# Patient Record
Sex: Female | Born: 1940 | Race: White | Hispanic: No | Smoking: Never smoker
Health system: Southern US, Community
[De-identification: ages and names within clinical notes are randomized; demographics above are authoritative.]

## PROBLEM LIST (undated history)

## (undated) DIAGNOSIS — M79604 Pain in right leg: Secondary | ICD-10-CM

## (undated) DIAGNOSIS — F32A Depression, unspecified: Secondary | ICD-10-CM

## (undated) DIAGNOSIS — E119 Type 2 diabetes mellitus without complications: Secondary | ICD-10-CM

## (undated) DIAGNOSIS — IMO0001 Reserved for inherently not codable concepts without codable children: Secondary | ICD-10-CM

## (undated) DIAGNOSIS — E7800 Pure hypercholesterolemia, unspecified: Secondary | ICD-10-CM

## (undated) DIAGNOSIS — M5126 Other intervertebral disc displacement, lumbar region: Secondary | ICD-10-CM

## (undated) DIAGNOSIS — R609 Edema, unspecified: Secondary | ICD-10-CM

## (undated) DIAGNOSIS — M5417 Radiculopathy, lumbosacral region: Secondary | ICD-10-CM

## (undated) DIAGNOSIS — M5416 Radiculopathy, lumbar region: Secondary | ICD-10-CM

## (undated) DIAGNOSIS — M5136 Other intervertebral disc degeneration, lumbar region: Secondary | ICD-10-CM

## (undated) DIAGNOSIS — K219 Gastro-esophageal reflux disease without esophagitis: Secondary | ICD-10-CM

## (undated) DIAGNOSIS — F419 Anxiety disorder, unspecified: Secondary | ICD-10-CM

## (undated) DIAGNOSIS — G4730 Sleep apnea, unspecified: Secondary | ICD-10-CM

## (undated) DIAGNOSIS — M79605 Pain in left leg: Secondary | ICD-10-CM

## (undated) DIAGNOSIS — F329 Major depressive disorder, single episode, unspecified: Secondary | ICD-10-CM

## (undated) DIAGNOSIS — M47817 Spondylosis without myelopathy or radiculopathy, lumbosacral region: Secondary | ICD-10-CM

## (undated) DIAGNOSIS — M1990 Unspecified osteoarthritis, unspecified site: Secondary | ICD-10-CM

## (undated) DIAGNOSIS — J449 Chronic obstructive pulmonary disease, unspecified: Secondary | ICD-10-CM

## (undated) DIAGNOSIS — I10 Essential (primary) hypertension: Secondary | ICD-10-CM

## (undated) HISTORY — DX: Reserved for inherently not codable concepts without codable children: IMO0001

## (undated) HISTORY — DX: Radiculopathy, lumbar region: M54.16

## (undated) HISTORY — DX: Anxiety disorder, unspecified: F41.9

## (undated) HISTORY — DX: Major depressive disorder, single episode, unspecified: F32.9

## (undated) HISTORY — DX: Depression, unspecified: F32.A

## (undated) HISTORY — PX: CORONARY ANGIOPLASTY WITH STENT PLACEMENT: SHX49

## (undated) HISTORY — DX: Pure hypercholesterolemia, unspecified: E78.00

## (undated) HISTORY — DX: Other intervertebral disc displacement, lumbar region: M51.26

## (undated) HISTORY — DX: Chronic obstructive pulmonary disease, unspecified: J44.9

## (undated) HISTORY — PX: ABDOMINAL HYSTERECTOMY: SHX81

## (undated) HISTORY — DX: Unspecified osteoarthritis, unspecified site: M19.90

## (undated) HISTORY — DX: Spondylosis without myelopathy or radiculopathy, lumbosacral region: M47.817

## (undated) HISTORY — DX: Sleep apnea, unspecified: G47.30

## (undated) HISTORY — DX: Edema, unspecified: R60.9

## (undated) HISTORY — DX: Pain in left leg: M79.605

## (undated) HISTORY — DX: Other intervertebral disc degeneration, lumbar region: M51.36

## (undated) HISTORY — DX: Pain in right leg: M79.604

## (undated) HISTORY — DX: Gastro-esophageal reflux disease without esophagitis: K21.9

## (undated) HISTORY — DX: Radiculopathy, lumbosacral region: M54.17

---

## 2004-07-03 ENCOUNTER — Ambulatory Visit: Payer: Self-pay | Admitting: Unknown Physician Specialty

## 2005-03-07 ENCOUNTER — Ambulatory Visit: Payer: Self-pay | Admitting: Internal Medicine

## 2005-03-25 ENCOUNTER — Ambulatory Visit: Payer: Self-pay | Admitting: Internal Medicine

## 2005-04-08 ENCOUNTER — Emergency Department: Payer: Self-pay | Admitting: Emergency Medicine

## 2005-04-16 ENCOUNTER — Ambulatory Visit: Payer: Self-pay | Admitting: Internal Medicine

## 2005-07-21 ENCOUNTER — Ambulatory Visit: Payer: Self-pay | Admitting: Family Medicine

## 2005-12-19 ENCOUNTER — Ambulatory Visit: Payer: Self-pay | Admitting: Internal Medicine

## 2005-12-24 ENCOUNTER — Emergency Department: Payer: Self-pay | Admitting: Emergency Medicine

## 2006-01-07 ENCOUNTER — Emergency Department: Payer: Self-pay | Admitting: Emergency Medicine

## 2006-05-25 ENCOUNTER — Other Ambulatory Visit: Payer: Self-pay

## 2006-05-25 ENCOUNTER — Emergency Department: Payer: Self-pay | Admitting: Emergency Medicine

## 2008-03-09 ENCOUNTER — Emergency Department: Payer: Self-pay | Admitting: Emergency Medicine

## 2008-05-18 ENCOUNTER — Ambulatory Visit: Payer: Self-pay | Admitting: Urology

## 2010-04-08 ENCOUNTER — Ambulatory Visit: Payer: Self-pay | Admitting: Unknown Physician Specialty

## 2010-04-09 LAB — PATHOLOGY REPORT

## 2010-08-13 ENCOUNTER — Ambulatory Visit: Payer: Self-pay | Admitting: Specialist

## 2011-07-24 ENCOUNTER — Ambulatory Visit: Payer: Self-pay

## 2011-10-06 ENCOUNTER — Ambulatory Visit: Payer: Self-pay | Admitting: Unknown Physician Specialty

## 2011-10-09 LAB — PATHOLOGY REPORT

## 2012-04-05 ENCOUNTER — Emergency Department: Payer: Self-pay | Admitting: Emergency Medicine

## 2012-04-05 LAB — PROTIME-INR
INR: 0.9
Prothrombin Time: 12.8 secs (ref 11.5–14.7)

## 2012-04-05 LAB — CBC WITH DIFFERENTIAL/PLATELET
Basophil #: 0.1 10*3/uL (ref 0.0–0.1)
Eosinophil #: 0.2 10*3/uL (ref 0.0–0.7)
Eosinophil %: 1.6 %
HCT: 41.6 % (ref 35.0–47.0)
HGB: 14.8 g/dL (ref 12.0–16.0)
Lymphocyte #: 2 10*3/uL (ref 1.0–3.6)
Lymphocyte %: 14.1 %
MCHC: 35.5 g/dL (ref 32.0–36.0)
MCV: 85 fL (ref 80–100)
Monocyte %: 7.2 %
Neutrophil #: 10.7 10*3/uL — ABNORMAL HIGH (ref 1.4–6.5)
Neutrophil %: 76.3 %
Platelet: 341 10*3/uL (ref 150–440)
RBC: 4.89 10*6/uL (ref 3.80–5.20)
RDW: 16.6 % — ABNORMAL HIGH (ref 11.5–14.5)
WBC: 14 10*3/uL — ABNORMAL HIGH (ref 3.6–11.0)

## 2012-04-05 LAB — COMPREHENSIVE METABOLIC PANEL
Anion Gap: 9 (ref 7–16)
BUN: 18 mg/dL (ref 7–18)
Bilirubin,Total: 1 mg/dL (ref 0.2–1.0)
Calcium, Total: 9.6 mg/dL (ref 8.5–10.1)
Chloride: 105 mmol/L (ref 98–107)
Co2: 25 mmol/L (ref 21–32)
Creatinine: 1.06 mg/dL (ref 0.60–1.30)
EGFR (African American): >60
EGFR (Non-African Amer.): 52 — ABNORMAL LOW
Osmolality: 281 (ref 275–301)
Potassium: 3.9 mmol/L (ref 3.5–5.1)
SGOT(AST): 52 U/L — ABNORMAL HIGH (ref 15–37)
Total Protein: 8 g/dL (ref 6.4–8.2)

## 2012-04-05 LAB — ACETAMINOPHEN LEVEL: Acetaminophen: <2 ug/mL

## 2012-04-05 LAB — APTT: Activated PTT: 28.4 secs (ref 23.6–35.9)

## 2014-02-07 DIAGNOSIS — G2581 Restless legs syndrome: Secondary | ICD-10-CM | POA: Insufficient documentation

## 2014-02-27 DIAGNOSIS — J449 Chronic obstructive pulmonary disease, unspecified: Secondary | ICD-10-CM | POA: Insufficient documentation

## 2014-02-27 DIAGNOSIS — G4730 Sleep apnea, unspecified: Secondary | ICD-10-CM | POA: Insufficient documentation

## 2014-05-01 DIAGNOSIS — M5116 Intervertebral disc disorders with radiculopathy, lumbar region: Secondary | ICD-10-CM | POA: Insufficient documentation

## 2014-06-19 ENCOUNTER — Ambulatory Visit: Payer: Self-pay | Admitting: Pain Medicine

## 2014-06-23 DIAGNOSIS — I2510 Atherosclerotic heart disease of native coronary artery without angina pectoris: Secondary | ICD-10-CM | POA: Insufficient documentation

## 2014-07-13 ENCOUNTER — Emergency Department: Payer: Self-pay | Admitting: Emergency Medicine

## 2014-07-13 LAB — CBC WITH DIFFERENTIAL/PLATELET
BASOS ABS: 0 10*3/uL (ref 0.0–0.1)
BASOS PCT: 0.1 %
Eosinophil #: 0.2 10*3/uL (ref 0.0–0.7)
Eosinophil %: 1.7 %
HCT: 35.1 % (ref 35.0–47.0)
HGB: 11.7 g/dL — AB (ref 12.0–16.0)
Lymphocyte #: 2.9 10*3/uL (ref 1.0–3.6)
Lymphocyte %: 22.3 %
MCH: 28.3 pg (ref 26.0–34.0)
MCHC: 33.4 g/dL (ref 32.0–36.0)
MCV: 85 fL (ref 80–100)
Monocyte #: 0.8 x10 3/mm (ref 0.2–0.9)
Monocyte %: 6.2 %
NEUTROS PCT: 69.7 %
Neutrophil #: 9.1 10*3/uL — ABNORMAL HIGH (ref 1.4–6.5)
Platelet: 335 10*3/uL (ref 150–440)
RBC: 4.14 10*6/uL (ref 3.80–5.20)
RDW: 17.3 % — ABNORMAL HIGH (ref 11.5–14.5)
WBC: 13.1 10*3/uL — AB (ref 3.6–11.0)

## 2014-07-13 LAB — BASIC METABOLIC PANEL
ANION GAP: 9 (ref 7–16)
BUN: 15 mg/dL (ref 7–18)
CHLORIDE: 100 mmol/L (ref 98–107)
CO2: 25 mmol/L (ref 21–32)
Calcium, Total: 8.7 mg/dL (ref 8.5–10.1)
Creatinine: 1.18 mg/dL (ref 0.60–1.30)
EGFR (African American): 58 — ABNORMAL LOW
EGFR (Non-African Amer.): 47 — ABNORMAL LOW
GLUCOSE: 156 mg/dL — AB (ref 65–99)
OSMOLALITY: 272 (ref 275–301)
Potassium: 4.2 mmol/L (ref 3.5–5.1)
SODIUM: 134 mmol/L — AB (ref 136–145)

## 2014-07-13 LAB — TROPONIN I

## 2014-07-17 ENCOUNTER — Ambulatory Visit: Payer: Self-pay | Admitting: Pain Medicine

## 2014-07-17 LAB — BASIC METABOLIC PANEL
Anion Gap: 4 — ABNORMAL LOW (ref 7–16)
BUN: 24 mg/dL — ABNORMAL HIGH (ref 7–18)
CO2: 31 mmol/L (ref 21–32)
Calcium, Total: 8.5 mg/dL (ref 8.5–10.1)
Chloride: 101 mmol/L (ref 98–107)
Creatinine: 1.26 mg/dL (ref 0.60–1.30)
EGFR (African American): 53 — ABNORMAL LOW
EGFR (Non-African Amer.): 44 — ABNORMAL LOW
Glucose: 89 mg/dL (ref 65–99)
OSMOLALITY: 275 (ref 275–301)
POTASSIUM: 4.6 mmol/L (ref 3.5–5.1)
Sodium: 136 mmol/L (ref 136–145)

## 2014-07-17 LAB — HEPATIC FUNCTION PANEL A (ARMC)
ALT: 31 U/L
Albumin: 3.3 g/dL — ABNORMAL LOW (ref 3.4–5.0)
Alkaline Phosphatase: 127 U/L — ABNORMAL HIGH
BILIRUBIN TOTAL: 0.3 mg/dL (ref 0.2–1.0)
Bilirubin, Direct: <0.1 mg/dL (ref 0.0–0.2)
SGOT(AST): 17 U/L (ref 15–37)
Total Protein: 7.1 g/dL (ref 6.4–8.2)

## 2014-07-17 LAB — SEDIMENTATION RATE: Erythrocyte Sed Rate: 35 mm/hr — ABNORMAL HIGH (ref 0–30)

## 2014-07-17 LAB — TSH: THYROID STIMULATING HORM: 3.72 u[IU]/mL

## 2014-07-17 LAB — MAGNESIUM: MAGNESIUM: 1.6 mg/dL — AB

## 2014-07-27 ENCOUNTER — Ambulatory Visit: Payer: Self-pay | Admitting: Pain Medicine

## 2014-09-20 DIAGNOSIS — R600 Localized edema: Secondary | ICD-10-CM | POA: Insufficient documentation

## 2014-10-12 ENCOUNTER — Ambulatory Visit: Payer: Self-pay | Admitting: Internal Medicine

## 2014-10-12 ENCOUNTER — Ambulatory Visit: Payer: Self-pay | Admitting: Pain Medicine

## 2015-01-09 ENCOUNTER — Other Ambulatory Visit: Payer: Self-pay | Admitting: Pain Medicine

## 2015-01-09 DIAGNOSIS — M5441 Lumbago with sciatica, right side: Secondary | ICD-10-CM

## 2015-01-09 DIAGNOSIS — M5442 Lumbago with sciatica, left side: Principal | ICD-10-CM

## 2015-01-30 ENCOUNTER — Ambulatory Visit
Admission: RE | Admit: 2015-01-30 | Discharge: 2015-01-30 | Disposition: A | Discharge status: Home / Self Care | Payer: Medicare Other | Source: Ambulatory Visit | Attending: Pain Medicine | Admitting: Pain Medicine

## 2015-01-30 DIAGNOSIS — M5442 Lumbago with sciatica, left side: Secondary | ICD-10-CM

## 2015-01-30 DIAGNOSIS — M5136 Other intervertebral disc degeneration, lumbar region: Secondary | ICD-10-CM

## 2015-01-30 DIAGNOSIS — M47816 Spondylosis without myelopathy or radiculopathy, lumbar region: Secondary | ICD-10-CM

## 2015-01-30 DIAGNOSIS — M545 Low back pain: Secondary | ICD-10-CM | POA: Diagnosis present

## 2015-01-30 DIAGNOSIS — M51369 Other intervertebral disc degeneration, lumbar region without mention of lumbar back pain or lower extremity pain: Secondary | ICD-10-CM

## 2015-01-30 DIAGNOSIS — M5441 Lumbago with sciatica, right side: Secondary | ICD-10-CM

## 2015-05-11 DIAGNOSIS — M47816 Spondylosis without myelopathy or radiculopathy, lumbar region: Secondary | ICD-10-CM | POA: Insufficient documentation

## 2015-05-11 DIAGNOSIS — M51369 Other intervertebral disc degeneration, lumbar region without mention of lumbar back pain or lower extremity pain: Secondary | ICD-10-CM

## 2015-05-11 DIAGNOSIS — M47817 Spondylosis without myelopathy or radiculopathy, lumbosacral region: Secondary | ICD-10-CM

## 2015-05-11 DIAGNOSIS — M5136 Other intervertebral disc degeneration, lumbar region: Secondary | ICD-10-CM

## 2015-05-11 HISTORY — DX: Other intervertebral disc degeneration, lumbar region without mention of lumbar back pain or lower extremity pain: M51.369

## 2015-05-11 HISTORY — DX: Spondylosis without myelopathy or radiculopathy, lumbosacral region: M47.817

## 2015-05-11 HISTORY — DX: Other intervertebral disc degeneration, lumbar region: M51.36

## 2015-05-11 NOTE — Addendum Note (Signed)
Encounter addended by: Delano Metz, MD on: 05/11/2015 11:24 AM<BR>     Documentation filed: Problem List

## 2015-05-15 DIAGNOSIS — E119 Type 2 diabetes mellitus without complications: Secondary | ICD-10-CM | POA: Insufficient documentation

## 2015-05-15 DIAGNOSIS — I10 Essential (primary) hypertension: Secondary | ICD-10-CM | POA: Insufficient documentation

## 2015-05-15 DIAGNOSIS — E785 Hyperlipidemia, unspecified: Secondary | ICD-10-CM | POA: Insufficient documentation

## 2015-05-21 ENCOUNTER — Other Ambulatory Visit: Payer: Self-pay | Admitting: Internal Medicine

## 2015-05-21 DIAGNOSIS — R319 Hematuria, unspecified: Secondary | ICD-10-CM

## 2015-05-24 ENCOUNTER — Ambulatory Visit
Admission: RE | Admit: 2015-05-24 | Discharge: 2015-05-24 | Disposition: A | Discharge status: Home / Self Care | Payer: Medicare Other | Source: Ambulatory Visit | Attending: Internal Medicine | Admitting: Internal Medicine

## 2015-05-24 DIAGNOSIS — R319 Hematuria, unspecified: Secondary | ICD-10-CM | POA: Diagnosis not present

## 2015-05-30 ENCOUNTER — Other Ambulatory Visit: Payer: Self-pay | Admitting: Internal Medicine

## 2015-05-30 DIAGNOSIS — N2889 Other specified disorders of kidney and ureter: Secondary | ICD-10-CM

## 2015-06-04 ENCOUNTER — Ambulatory Visit
Admission: RE | Admit: 2015-06-04 | Discharge: 2015-06-04 | Disposition: A | Discharge status: Home / Self Care | Payer: Medicare Other | Source: Ambulatory Visit | Attending: Internal Medicine | Admitting: Internal Medicine

## 2015-06-04 ENCOUNTER — Ambulatory Visit: Payer: Medicare Other

## 2015-06-04 DIAGNOSIS — N2889 Other specified disorders of kidney and ureter: Secondary | ICD-10-CM | POA: Diagnosis not present

## 2015-06-04 HISTORY — DX: Type 2 diabetes mellitus without complications: E11.9

## 2015-06-04 HISTORY — DX: Essential (primary) hypertension: I10

## 2015-06-04 MED ORDER — IOHEXOL 350 MG/ML SOLN
100.0000 mL | Freq: Once | INTRAVENOUS | Status: AC | PRN
  Administered 2015-06-04: 100 mL via INTRAVENOUS

## 2015-06-20 ENCOUNTER — Ambulatory Visit: Payer: Medicare Other | Attending: Pain Medicine | Admitting: Pain Medicine

## 2015-06-20 ENCOUNTER — Encounter: Payer: Self-pay | Admitting: Pain Medicine

## 2015-06-20 VITALS — BP 132/59 | HR 67 | Temp 98.4°F | Resp 18 | Ht 65.0 in | Wt 212.0 lb

## 2015-06-20 DIAGNOSIS — F1190 Opioid use, unspecified, uncomplicated: Secondary | ICD-10-CM

## 2015-06-20 DIAGNOSIS — M5417 Radiculopathy, lumbosacral region: Secondary | ICD-10-CM

## 2015-06-20 DIAGNOSIS — E119 Type 2 diabetes mellitus without complications: Secondary | ICD-10-CM | POA: Insufficient documentation

## 2015-06-20 DIAGNOSIS — M5126 Other intervertebral disc displacement, lumbar region: Secondary | ICD-10-CM | POA: Diagnosis not present

## 2015-06-20 DIAGNOSIS — M79604 Pain in right leg: Secondary | ICD-10-CM | POA: Insufficient documentation

## 2015-06-20 DIAGNOSIS — F329 Major depressive disorder, single episode, unspecified: Secondary | ICD-10-CM | POA: Diagnosis not present

## 2015-06-20 DIAGNOSIS — K219 Gastro-esophageal reflux disease without esophagitis: Secondary | ICD-10-CM | POA: Insufficient documentation

## 2015-06-20 DIAGNOSIS — F419 Anxiety disorder, unspecified: Secondary | ICD-10-CM | POA: Diagnosis not present

## 2015-06-20 DIAGNOSIS — I10 Essential (primary) hypertension: Secondary | ICD-10-CM | POA: Diagnosis not present

## 2015-06-20 DIAGNOSIS — M25569 Pain in unspecified knee: Secondary | ICD-10-CM

## 2015-06-20 DIAGNOSIS — G894 Chronic pain syndrome: Secondary | ICD-10-CM

## 2015-06-20 DIAGNOSIS — Z5181 Encounter for therapeutic drug level monitoring: Secondary | ICD-10-CM | POA: Insufficient documentation

## 2015-06-20 DIAGNOSIS — M25552 Pain in left hip: Secondary | ICD-10-CM | POA: Diagnosis not present

## 2015-06-20 DIAGNOSIS — M549 Dorsalgia, unspecified: Secondary | ICD-10-CM | POA: Diagnosis present

## 2015-06-20 DIAGNOSIS — M79605 Pain in left leg: Secondary | ICD-10-CM | POA: Diagnosis present

## 2015-06-20 DIAGNOSIS — F32A Depression, unspecified: Secondary | ICD-10-CM

## 2015-06-20 DIAGNOSIS — M47816 Spondylosis without myelopathy or radiculopathy, lumbar region: Secondary | ICD-10-CM | POA: Insufficient documentation

## 2015-06-20 DIAGNOSIS — M5136 Other intervertebral disc degeneration, lumbar region: Secondary | ICD-10-CM | POA: Diagnosis not present

## 2015-06-20 DIAGNOSIS — M545 Low back pain, unspecified: Secondary | ICD-10-CM | POA: Insufficient documentation

## 2015-06-20 DIAGNOSIS — F1120 Opioid dependence, uncomplicated: Secondary | ICD-10-CM

## 2015-06-20 DIAGNOSIS — J449 Chronic obstructive pulmonary disease, unspecified: Secondary | ICD-10-CM | POA: Diagnosis not present

## 2015-06-20 DIAGNOSIS — G8929 Other chronic pain: Secondary | ICD-10-CM | POA: Insufficient documentation

## 2015-06-20 DIAGNOSIS — M5416 Radiculopathy, lumbar region: Secondary | ICD-10-CM

## 2015-06-20 DIAGNOSIS — Z79891 Long term (current) use of opiate analgesic: Secondary | ICD-10-CM | POA: Insufficient documentation

## 2015-06-20 HISTORY — DX: Pain in left leg: M79.605

## 2015-06-20 HISTORY — DX: Other intervertebral disc displacement, lumbar region: M51.26

## 2015-06-20 HISTORY — DX: Radiculopathy, lumbar region: M54.16

## 2015-06-20 HISTORY — DX: Radiculopathy, lumbosacral region: M54.17

## 2015-06-20 HISTORY — DX: Pain in right leg: M79.604

## 2015-06-20 MED ORDER — OXYCODONE HCL 5 MG PO TABS
5.0000 mg | ORAL_TABLET | Freq: Four times a day (QID) | ORAL | Status: DC | PRN
Start: 2015-06-20 — End: 2015-09-20

## 2015-06-20 MED ORDER — OXYCODONE HCL 5 MG PO CAPS: 5.0000 mg | ORAL_CAPSULE | Freq: Four times a day (QID) | ORAL | Status: DC | PRN

## 2015-06-20 MED ORDER — OXYCODONE HCL ER 10 MG PO T12A
10.0000 mg | EXTENDED_RELEASE_TABLET | Freq: Two times a day (BID) | ORAL | Status: DC
Start: 2015-06-20 — End: 2015-09-20

## 2015-06-20 MED ORDER — OXYCODONE HCL ER 10 MG PO T12A: 10.0000 mg | EXTENDED_RELEASE_TABLET | Freq: Two times a day (BID) | ORAL | Status: DC

## 2015-06-20 NOTE — Progress Notes (Signed)
Did not bring pain meds for pill count.

## 2015-06-20 NOTE — Progress Notes (Signed)
Patient's Name: Janice Garza MRN: [id number] DOB: 25-Nov-1940 DOS: 06/20/2015  Primary Reason(s) for Visit: Encounter for Medication Management. CC: Back Pain and Leg Pain   HPI:   Janice Garza is a 74 y.o. year old, female patient, who returns today as an established patient. She has Lumbar spondylosis; Lumbar Degenerative Disc Disease; Radicular pain of lumbosacral region; Lumbar radicular pain; Chronic pain syndrome; Chronic pain; Chronic obstructive pulmonary disease (HCC); CAD in native artery; HLD (hyperlipidemia); BP (high blood pressure); Neuritis or radiculitis due to rupture of lumbar intervertebral disc; Lumbar and sacral osteoarthritis; Edema of foot; Restless leg; Apnea, sleep; Type 2 diabetes mellitus (HCC); Bilateral lower extremity pain; Knee pain; Encounter for therapeutic drug level monitoring; Long term current use of opiate analgesic; Uncomplicated opioid dependence (HCC); Opiate use; Depression; Anxiety disorder; Chronic left hip pain; Facet syndrome, lumbar; Chronic low back pain; and Displacement of lumbar intervertebral disc on her problem list.. Her primarily concern today is the Back Pain and Leg Pain      Pharmacotherapy Review: Side-effects or Adverse reactions: None reported. Effectiveness: Described as relatively effective, allowing for increase in activities of daily living (ADL). Onset of action: Within expected pharmacological parameters. Duration of action: Within normal limits for medication. Peak effect: Timing and results are as within normal expected parameters. [place] PMP: Compliant with practice rules and regulations. DST: Compliant with practice rules and regulations. Lab work: No new labs ordered by our practice. Treatment compliance: Compliant. Substance Use Disorder (SUD) Risk Level: Low Planned course of action: Continue therapy as is.  Allergies: Janice Garza has No Known Allergies.  Meds: The patient has a current medication list which  includes the following prescription(s): aspirin, benazepril, buspirone, clotrimazole-betamethasone, docusate sodium, fluoxetine, fluticasone, fluticasone-salmeterol, furosemide, gabapentin, ibuprofen, meclizine, metformin, omeprazole, oxycodone, oxycodone, pramipexole, pravastatin, ropinirole, tiotropium, oxycodone, oxycodone, oxycodone, and oxycodone. Requested Prescriptions   Signed Prescriptions Disp Refills  . oxycodone (OXY-IR) 5 MG capsule 120 capsule 0    Sig: Take 1 capsule (5 mg total) by mouth every 6 (six) hours as needed.  . OxyCODONE (OXYCONTIN) 10 mg T12A 12 hr tablet 60 tablet 0    Sig: Take 1 tablet (10 mg total) by mouth every 12 (twelve) hours.  . OxyCODONE (OXYCONTIN) 10 mg T12A 12 hr tablet 60 tablet 0    Sig: Take 1 tablet (10 mg total) by mouth every 12 (twelve) hours.  Marland Kitchen oxyCODONE (OXY IR/ROXICODONE) 5 MG immediate release tablet 120 tablet 0    Sig: Take 1 tablet (5 mg total) by mouth every 6 (six) hours as needed for severe pain.  . OxyCODONE (OXYCONTIN) 10 mg T12A 12 hr tablet 60 tablet 0    Sig: Take 1 tablet (10 mg total) by mouth every 12 (twelve) hours.  Marland Kitchen oxyCODONE (OXY IR/ROXICODONE) 5 MG immediate release tablet 120 tablet 0    Sig: Take 1 tablet (5 mg total) by mouth every 6 (six) hours as needed for severe pain.    ROS: Constitutional: Afebrile, no chills, well hydrated and well nourished Gastrointestinal: negative Musculoskeletal:negative Neurological: negative Behavioral/Psych: negative  PFSH: Medical:  Janice Garza  has a past medical history of Hypertension; Diabetes mellitus without complication (HCC); GERD (gastroesophageal reflux disease); Sleep apnea; Hypercholesteremia; COPD (chronic obstructive pulmonary disease) (HCC); Reflux; Arthritis; Anxiety; Depression; and Edema. Family: family history includes Alcohol abuse in her father; Diverticulitis in her father; Hypertension in her mother. Surgical:  has past surgical history that includes  Abdominal hysterectomy and Coronary angioplasty with stent. Tobacco:  reports that  she has never smoked. She does not have any smokeless tobacco history on file. Alcohol:  reports that she does not drink alcohol. Drug:  reports that she does not use illicit drugs.  Physical Exam: Vitals:  Today's Vitals   06/20/15 1316 06/20/15 1319  BP: 132/59   Pulse: 67   Temp: 98.4 F (36.9 C)   TempSrc: Oral   Resp: 18   Height: 5\' 5"  (1.651 m)   Weight: 212 lb (96.163 kg)   SpO2: 97%   PainSc: 7  7   PainLoc: Back   Calculated BMI: Body mass index is 35.28 kg/(m^2). General appearance: alert, cooperative, appears older than stated age, no distress, morbidly obese and pale Eyes: conjunctivae/corneas clear. PERRL, EOM's intact. Fundi benign. Lungs: No evidence respiratory distress, no audible rales or ronchi and no use of accessory muscles of respiration Neck: no adenopathy, no carotid bruit, no JVD, supple, symmetrical, trachea midline and thyroid not enlarged, symmetric, no tenderness/mass/nodules Back: symmetric, no curvature. ROM normal. No CVA tenderness. Extremities: extremities normal, atraumatic, no cyanosis or edema Pulses: 2+ and symmetric Skin: Skin color, texture, turgor normal. No rashes or lesions Neurologic: Gait: the patient comes into the clinic today accompanied by her daughter. She is in a wheelchair. She is doing well.    Assessment: Encounter Diagnosis:  Primary Diagnosis: Chronic pain [G89.29]  Plan: Janice Garza was seen today for back pain and leg pain.  Diagnoses and all orders for this visit:  Chronic pain -     oxycodone (OXY-IR) 5 MG capsule; Take 1 capsule (5 mg total) by mouth every 6 (six) hours as needed. -     OxyCODONE (OXYCONTIN) 10 mg T12A 12 hr tablet; Take 1 tablet (10 mg total) by mouth every 12 (twelve) hours. -     OxyCODONE (OXYCONTIN) 10 mg T12A 12 hr tablet; Take 1 tablet (10 mg total) by mouth every 12 (twelve) hours. -     oxyCODONE (OXY  IR/ROXICODONE) 5 MG immediate release tablet; Take 1 tablet (5 mg total) by mouth every 6 (six) hours as needed for severe pain. -     OxyCODONE (OXYCONTIN) 10 mg T12A 12 hr tablet; Take 1 tablet (10 mg total) by mouth every 12 (twelve) hours. -     oxyCODONE (OXY IR/ROXICODONE) 5 MG immediate release tablet; Take 1 tablet (5 mg total) by mouth every 6 (six) hours as needed for severe pain.  Chronic pain syndrome  Lumbar radicular pain  Radicular pain of lumbosacral region Comments: left lower extremity  Displacement of lumbar intervertebral disc  Chronic low back pain  Facet syndrome, lumbar  Chronic left hip pain  Anxiety disorder, unspecified anxiety disorder type  Depression  Opiate use  Uncomplicated opioid dependence (HCC)  Long term current use of opiate analgesic -     Drugs of abuse screen w/o alc, rtn urine-sln; Future  Encounter for therapeutic drug level monitoring  Knee pain, unspecified laterality  Bilateral lower extremity pain     There are no Patient Instructions on file for this visit. Medications discontinued today:  Medications Discontinued During This Encounter  Medication Reason  . oxycodone (OXY-IR) 5 MG capsule Reorder  . OxyCODONE (OXYCONTIN) 10 mg T12A 12 hr tablet Reorder   Medications administered today:  Janice Garza had no medications administered during this visit.  Primary Care Physician: Rafael BihariWALKER III, JOHN B, MD [facility] Note by: Sydnee LevansFrancisco A. Laban EmperorNaveira, M.D, DABA, DABAPM, DABPM, DABIPP, FIPP

## 2015-06-20 NOTE — Progress Notes (Signed)
Safety precautions to be maintained throughout the outpatient stay will include: orient to surroundings, keep bed in low position, maintain call bell within reach at all times, provide assistance with transfer out of bed and ambulation.  

## 2015-07-13 ENCOUNTER — Other Ambulatory Visit: Payer: Self-pay | Admitting: Pain Medicine

## 2015-07-16 ENCOUNTER — Other Ambulatory Visit: Payer: Self-pay

## 2015-07-16 MED ORDER — CLOTRIMAZOLE-BETAMETHASONE 1-0.05 % EX CREA: 1.0000 "application " | TOPICAL_CREAM | Freq: Two times a day (BID) | CUTANEOUS | Status: AC

## 2015-07-17 ENCOUNTER — Other Ambulatory Visit: Payer: Self-pay | Admitting: Nurse Practitioner

## 2015-07-17 DIAGNOSIS — R110 Nausea: Secondary | ICD-10-CM

## 2015-07-25 ENCOUNTER — Ambulatory Visit: Admission: RE | Admit: 2015-07-25 | Payer: Medicare Other | Source: Ambulatory Visit

## 2015-09-19 ENCOUNTER — Encounter: Payer: Medicare Other | Admitting: Pain Medicine

## 2015-09-20 ENCOUNTER — Other Ambulatory Visit: Payer: Self-pay | Admitting: Pain Medicine

## 2015-09-20 ENCOUNTER — Ambulatory Visit: Payer: Medicare Other | Attending: Pain Medicine | Admitting: Pain Medicine

## 2015-09-20 ENCOUNTER — Encounter: Payer: Self-pay | Admitting: Pain Medicine

## 2015-09-20 VITALS — BP 120/52 | HR 64 | Temp 98.6°F | Resp 18 | Ht 65.0 in | Wt 223.0 lb

## 2015-09-20 DIAGNOSIS — M4726 Other spondylosis with radiculopathy, lumbar region: Secondary | ICD-10-CM

## 2015-09-20 DIAGNOSIS — M51369 Other intervertebral disc degeneration, lumbar region without mention of lumbar back pain or lower extremity pain: Secondary | ICD-10-CM

## 2015-09-20 DIAGNOSIS — E119 Type 2 diabetes mellitus without complications: Secondary | ICD-10-CM | POA: Diagnosis not present

## 2015-09-20 DIAGNOSIS — J449 Chronic obstructive pulmonary disease, unspecified: Secondary | ICD-10-CM | POA: Diagnosis not present

## 2015-09-20 DIAGNOSIS — M546 Pain in thoracic spine: Secondary | ICD-10-CM | POA: Insufficient documentation

## 2015-09-20 DIAGNOSIS — G8929 Other chronic pain: Secondary | ICD-10-CM | POA: Insufficient documentation

## 2015-09-20 DIAGNOSIS — M47896 Other spondylosis, lumbar region: Secondary | ICD-10-CM | POA: Diagnosis not present

## 2015-09-20 DIAGNOSIS — R52 Pain, unspecified: Secondary | ICD-10-CM

## 2015-09-20 DIAGNOSIS — M79606 Pain in leg, unspecified: Secondary | ICD-10-CM | POA: Insufficient documentation

## 2015-09-20 DIAGNOSIS — K219 Gastro-esophageal reflux disease without esophagitis: Secondary | ICD-10-CM | POA: Diagnosis not present

## 2015-09-20 DIAGNOSIS — M25552 Pain in left hip: Secondary | ICD-10-CM | POA: Insufficient documentation

## 2015-09-20 DIAGNOSIS — M5136 Other intervertebral disc degeneration, lumbar region: Secondary | ICD-10-CM | POA: Insufficient documentation

## 2015-09-20 DIAGNOSIS — F419 Anxiety disorder, unspecified: Secondary | ICD-10-CM | POA: Insufficient documentation

## 2015-09-20 DIAGNOSIS — M25569 Pain in unspecified knee: Secondary | ICD-10-CM | POA: Diagnosis not present

## 2015-09-20 DIAGNOSIS — I10 Essential (primary) hypertension: Secondary | ICD-10-CM | POA: Diagnosis not present

## 2015-09-20 DIAGNOSIS — F329 Major depressive disorder, single episode, unspecified: Secondary | ICD-10-CM | POA: Insufficient documentation

## 2015-09-20 DIAGNOSIS — Z7189 Other specified counseling: Secondary | ICD-10-CM

## 2015-09-20 DIAGNOSIS — Z79891 Long term (current) use of opiate analgesic: Secondary | ICD-10-CM

## 2015-09-20 DIAGNOSIS — M47816 Spondylosis without myelopathy or radiculopathy, lumbar region: Secondary | ICD-10-CM

## 2015-09-20 DIAGNOSIS — Z79899 Other long term (current) drug therapy: Secondary | ICD-10-CM | POA: Diagnosis not present

## 2015-09-20 DIAGNOSIS — M545 Low back pain: Secondary | ICD-10-CM

## 2015-09-20 DIAGNOSIS — Z5189 Encounter for other specified aftercare: Secondary | ICD-10-CM

## 2015-09-20 DIAGNOSIS — M5416 Radiculopathy, lumbar region: Secondary | ICD-10-CM

## 2015-09-20 DIAGNOSIS — M549 Dorsalgia, unspecified: Secondary | ICD-10-CM | POA: Diagnosis present

## 2015-09-20 DIAGNOSIS — M47817 Spondylosis without myelopathy or radiculopathy, lumbosacral region: Secondary | ICD-10-CM

## 2015-09-20 DIAGNOSIS — E7800 Pure hypercholesterolemia, unspecified: Secondary | ICD-10-CM | POA: Insufficient documentation

## 2015-09-20 MED ORDER — OXYCODONE HCL 10 MG PO TABS: 10.0000 mg | ORAL_TABLET | Freq: Four times a day (QID) | ORAL | Status: AC | PRN

## 2015-09-20 NOTE — Progress Notes (Signed)
Patient's Name: Janice Garza MRN: [id number] DOB: October 07, 1940 DOS: 09/20/2015  Primary Reason(s) for Visit: Encounter for Medication Management CC: Back Pain and Leg Pain   HPI:    Janice Garza is a 75 y.o. year old, female patient, who returns today as an established patient. She has Lumbar spondylosis; Chronic pain syndrome; Chronic pain; Chronic obstructive pulmonary disease (HCC); CAD in native artery; HLD (hyperlipidemia); BP (high blood pressure); Neuritis or radiculitis due to rupture of lumbar intervertebral disc; Edema of foot; Restless leg; Apnea, sleep; Type 2 diabetes mellitus (HCC); Encounter for therapeutic drug level monitoring; Long term current use of opiate analgesic; Uncomplicated opioid dependence (HCC); Opiate use; Depression; Anxiety disorder; Chronic hip pain (Left); Lumbar facet syndrome (Bilateral); Chronic low back pain; Long term prescription opiate use; Chronic pain of lower extremity (Bilateral); Chronic knee pain; Chronic lumbar radicular pain (Left); Encounter for chronic pain management; and Pain management on her problem list.. Her primarily concern today is the Back Pain and Leg Pain   The patient returns to the clinics today for pharmacological management of her chronic pain. The patient indicates that she is having some problems with her current medications and she feels that what she had before was working better.  Today's Pain Score: 8 , clinically she looks like a 2-3/10. Reported level of pain is incompatible with clinical obrservations. This may be secondary to a possible lack of understanding on how the pain scale works. Pain Type: Chronic pain Pain Descriptors / Indicators: Aching Pain Frequency: Constant  Date of Last Visit: 06/20/15 Service Provided on Last Visit: Med Refill  Pharmacotherapy Review:   she is currently taking OxyContin 10 mg every 12 hours and oxycodone IR 5 mg every 6 hours when necessary. Side-effects or Adverse reactions: The  patient returns that she is constantly sleepy Effectiveness: Described as relatively effective, allowing for increase in activities of daily living (ADL) Onset of action: Within expected pharmacological parameters Duration of action: Within normal limits for medication Peak effect: Timing and results are as within normal expected parameters [place] PMP: Compliant with practice rules and regulations UDS Results: Results of the patient's last UDS done on 06/21/2015 were within normal limits with no all reported substance found. UDS Interpretation: Patient appears to be compliant with practice rules and regulations Medication Assessment Form: Reviewed. Patient indicates being compliant with therapy Treatment compliance: Compliant Substance Use Disorder (SUD) Risk Level: Low Pharmacologic Plan: Today I will discontinue the OxyContin temporary back on the immediate release oxycodone 10 mg by mouth every 6 hours. Although it ends up being the exactly same dose, she seems to tolerate better the immediate release them the extended release.  Lab Work: Illicit Drugs No results found for: THCU, COCAINSCRNUR, PCPSCRNUR, MDMA, AMPHETMU, METHADONE, ETOH  Inflammation Markers Lab Results  Component Value Date   ESRSEDRATE 35* 07/17/2014    Renal Function Lab Results  Component Value Date   BUN 24* 07/17/2014   CREATININE 1.26 07/17/2014   GFRAA 53* 07/17/2014   GFRNONAA 44* 07/17/2014    Hepatic Function Lab Results  Component Value Date   AST 17 07/17/2014   ALT 31 07/17/2014   ALBUMIN 3.3* 07/17/2014    Electrolytes Lab Results  Component Value Date   NA 136 07/17/2014   K 4.6 07/17/2014   CL 101 07/17/2014   CALCIUM 8.5 07/17/2014   MG 1.6* 07/17/2014    Allergies:  Janice Garza has No Known Allergies.  Meds:  The patient has a current medication list which includes  the following prescription(s): aspirin, benazepril, buspirone, clotrimazole-betamethasone, docusate sodium,  fluoxetine, fluticasone, fluticasone-salmeterol, furosemide, gabapentin, ibuprofen, meclizine, metformin, omeprazole, pramipexole, pravastatin, ropinirole, tiotropium, oxycodone hcl, oxycodone hcl, and oxycodone hcl. Requested Prescriptions   Signed Prescriptions Disp Refills  . Oxycodone HCl 10 MG TABS 120 tablet 0    Sig: Take 1 tablet (10 mg total) by mouth every 6 (six) hours as needed.  . Oxycodone HCl 10 MG TABS 120 tablet 0    Sig: Take 1 tablet (10 mg total) by mouth every 6 (six) hours as needed.  . Oxycodone HCl 10 MG TABS 120 tablet 0    Sig: Take 1 tablet (10 mg total) by mouth every 6 (six) hours as needed.    ROS:  Constitutional: Afebrile, no chills, well hydrated and well nourished Gastrointestinal: negative Musculoskeletal:negative Neurological: negative Behavioral/Psych: negative  PFSH:  Medical:  Janice Garza  has a past medical history of Hypertension; Diabetes mellitus without complication (HCC); GERD (gastroesophageal reflux disease); Sleep apnea; Hypercholesteremia; COPD (chronic obstructive pulmonary disease) (HCC); Reflux; Arthritis; Anxiety; Depression; Edema; Bilateral lower extremity pain (06/20/2015); Displacement of lumbar intervertebral disc (06/20/2015); Lumbar Degenerative Disc Disease (05/11/2015); Lumbar and sacral osteoarthritis (05/11/2015); Radicular pain of lumbosacral region (06/20/2015); and Lumbar radicular pain (06/20/2015). Family: family history includes Alcohol abuse in her father; Diverticulitis in her father; Hypertension in her mother. Surgical:  has past surgical history that includes Abdominal hysterectomy and Coronary angioplasty with stent. Tobacco:  reports that she has never smoked. She does not have any smokeless tobacco history on file. Alcohol:  reports that she does not drink alcohol. Drug:  reports that she does not use illicit drugs.  Physical Exam:  Vitals:  Today's Vitals   09/20/15 1135  BP: 120/52  Pulse: 64  Temp: 98.6 F  (37 C)  TempSrc: Oral  Resp: 18  Height: 5\' 5"  (1.651 m)  Weight: 223 lb (101.152 kg)  SpO2: 91%  PainSc: 8   Calculated BMI: Body mass index is 37.11 kg/(m^2). General appearance: alert, cooperative, appears stated age, distracted, no distress, morbidly obese, slowed mentation and sleepy Eyes: PERLA Respiratory: No evidence respiratory distress, no audible rales or ronchi and no use of accessory muscles of respiration Neck: no adenopathy, no carotid bruit, no JVD, supple, symmetrical, trachea midline and thyroid not enlarged, symmetric, no tenderness/mass/nodules  Cervical Spine Inspection: No gross anomalies detected ROM: Adequate Palpation: Non-contributory  Upper Extremities Inspection: No gross anomalies detected ROM: Adequate Strength: 5/5 for all flexors and extensors of the upper extremity, bilaterally Pulses: Palpable bilaterally Neurologic: No allodynia, No hyperesthesia, No hyperpathia and No sensory abnormalities  Lumbar Spine Inspection: No gross anomalies detected ROM: Decreased Palpation: Non-contributory Lumbar Hyperextension and rotation: Positive bilaterally. Patrick's Maneuver: Non-contributory Gait: Patient comes in today in a wheelchair. This is likely due to a combination of her pain, guarding, as well as morbid obesity.  Lower Extremities Inspection: No gross anomalies detected ROM: Adequate Strength: 5/5 for all flexors and extensors of the lower extremity, bilaterally Pulses: Palpable bilaterally Neurologic: No allodynia, No hyperesthesia, No hyperpathia, No sensory abnormalities and No antalgic gait or posture  Assessment:  Encounter Diagnosis:  Primary Diagnosis: Chronic pain [G89.29]  Plan:   Interventional Therapies: None at this point.    Janice Garza was seen today for back pain and leg pain.  Diagnoses and all orders for this visit:  Chronic pain -     Oxycodone HCl 10 MG TABS; Take 1 tablet (10 mg total) by mouth every 6 (six) hours as  needed. -  Oxycodone HCl 10 MG TABS; Take 1 tablet (10 mg total) by mouth every 6 (six) hours as needed. -     Oxycodone HCl 10 MG TABS; Take 1 tablet (10 mg total) by mouth every 6 (six) hours as needed.  Long term prescription opiate use  Chronic pain of lower extremity, unspecified laterality  Chronic knee pain, unspecified laterality  Lumbar Degenerative Disc Disease  Lumbar and sacral osteoarthritis  Osteoarthritis of spine with radiculopathy, lumbar region  Chronic lumbar radicular pain (Left)  Lumbar facet syndrome (Bilateral)  Encounter for chronic pain management  Pain management  Chronic hip pain (Left)  Other orders -     Cancel: oxyCODONE (OXY IR/ROXICODONE) 5 MG immediate release tablet; Take 1 tablet (5 mg total) by mouth every 6 (six) hours as needed for severe pain. -     Cancel: oxyCODONE (OXY IR/ROXICODONE) 5 MG immediate release tablet; Take 1 tablet (5 mg total) by mouth every 6 (six) hours as needed for severe pain. -     Cancel: oxycodone (OXY-IR) 5 MG capsule; Take 1 capsule (5 mg total) by mouth every 6 (six) hours as needed.     There are no Patient Instructions on file for this visit. Medications discontinued today:  Medications Discontinued During This Encounter  Medication Reason  . OxyCODONE (OXYCONTIN) 10 mg T12A 12 hr tablet   . OxyCODONE (OXYCONTIN) 10 mg T12A 12 hr tablet   . OxyCODONE (OXYCONTIN) 10 mg T12A 12 hr tablet   . oxyCODONE (OXY IR/ROXICODONE) 5 MG immediate release tablet   . oxyCODONE (OXY IR/ROXICODONE) 5 MG immediate release tablet   . oxycodone (OXY-IR) 5 MG capsule    Medications administered today:  Ms. Ancheta had no medications administered during this visit.  Primary Care Physician: Rafael Bihari, MD Location: [facility] Note by: Sydnee Levans Laban Emperor, M.D, DABA, DABAPM, DABPM, DABIPP, FIPP

## 2015-09-20 NOTE — Progress Notes (Signed)
Safety precautions to be maintained throughout the outpatient stay will include: orient to surroundings, keep bed in low position, maintain call bell within reach at all times, provide assistance with transfer out of bed and ambulation.  Oxycodone 5 mg has 1 remaining tab Oxycodone 10 mg has 0 remaining tabs

## 2015-09-21 DIAGNOSIS — R52 Pain, unspecified: Secondary | ICD-10-CM | POA: Insufficient documentation

## 2015-09-21 DIAGNOSIS — G8929 Other chronic pain: Secondary | ICD-10-CM | POA: Insufficient documentation

## 2015-09-21 NOTE — Assessment & Plan Note (Signed)
This is likely to be responsible for the patient's low back pain. Long-term plan involves diagnostic lumbar facet blocks, which if effective, we will then want to RFA.

## 2015-09-21 NOTE — Assessment & Plan Note (Signed)
This could be radicular or intrinsic treated the joint itself.

## 2015-09-27 LAB — TOXASSURE SELECT 13 (MW), URINE: PDF: 0

## 2015-12-03 ENCOUNTER — Encounter: Payer: Medicare Other | Admitting: Pain Medicine

## 2015-12-12 ENCOUNTER — Encounter: Payer: Medicare Other | Admitting: Pain Medicine

## 2015-12-13 ENCOUNTER — Encounter: Payer: Medicare Other | Admitting: Pain Medicine

## 2016-01-07 DEATH — deceased

## 2017-06-28 IMAGING — CT CT ABDOMEN WO/W CM
3 of 10 series · 11 of 46 positions shown, 17 images · IV contrast (omnipaque)
Comparison: Renal ultrasound dated 05/24/2015

CLINICAL DATA: Evaluate left renal mass on ultrasound

EXAM:
CT ABDOMEN WITHOUT AND WITH CONTRAST
TECHNIQUE: Multidetector CT imaging of the abdomen was performed following the
standard protocol before and following the bolus administration of
intravenous contrast.
CONTRAST:  100mL OMNIPAQUE IOHEXOL 350 MG/ML SOLN

[Series 6: renal without- · axial · non-contrast · 0.79mm/px · z∈[-716,-528]mm · 7 of 126 slices shown, 12 images]
[im 16/126  soft-tissue]
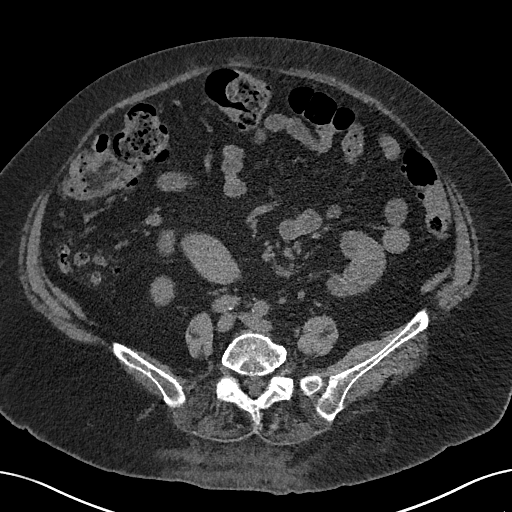
[im 16/126  bone]
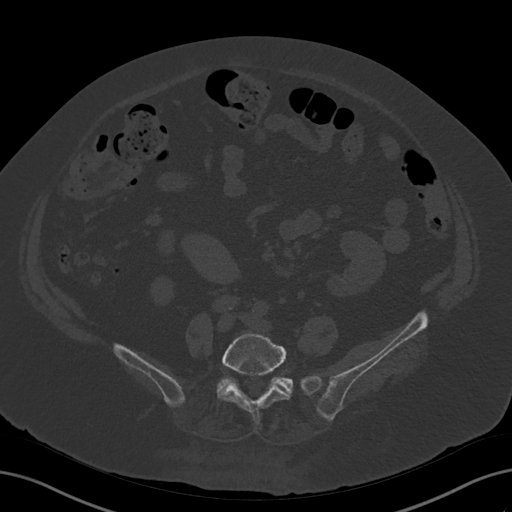
[im 32/126  soft-tissue]
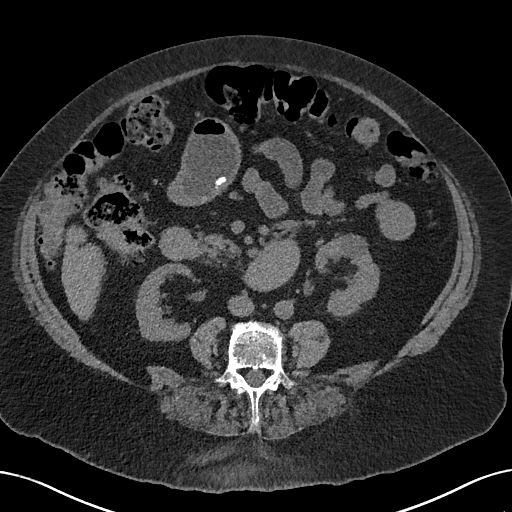
[im 47/126  soft-tissue]
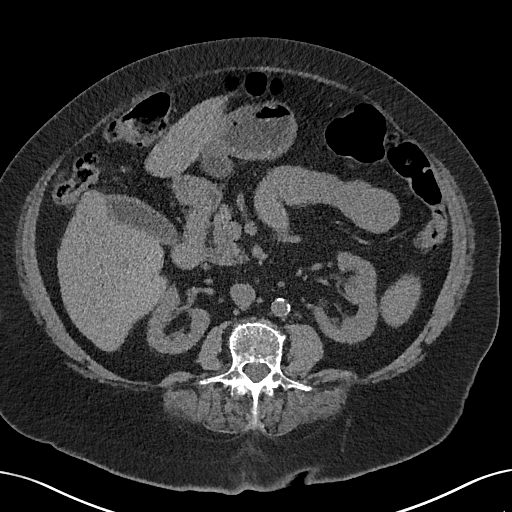
[im 63/126  soft-tissue]
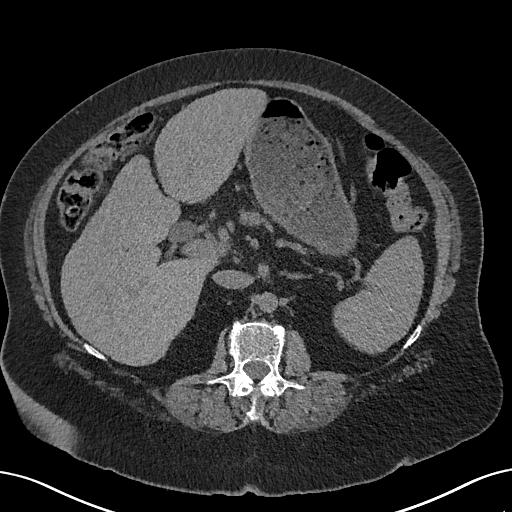
[im 63/126  lung]
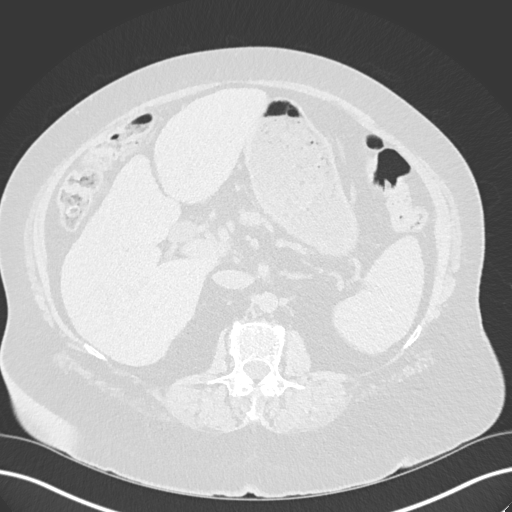
[im 79/126  soft-tissue]
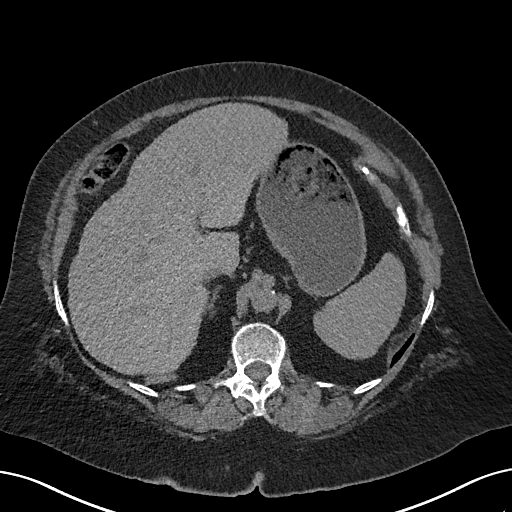
[im 79/126  lung]
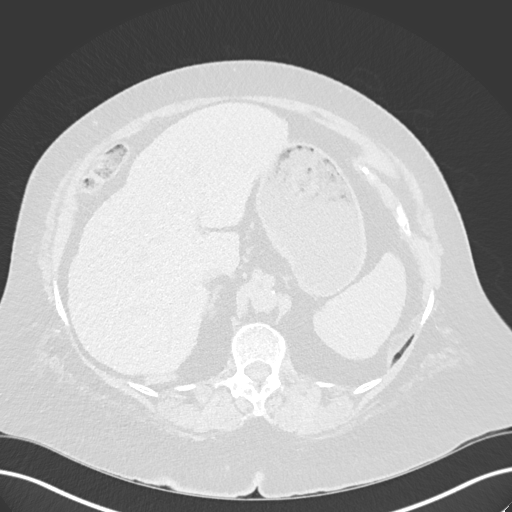
[im 94/126  soft-tissue]
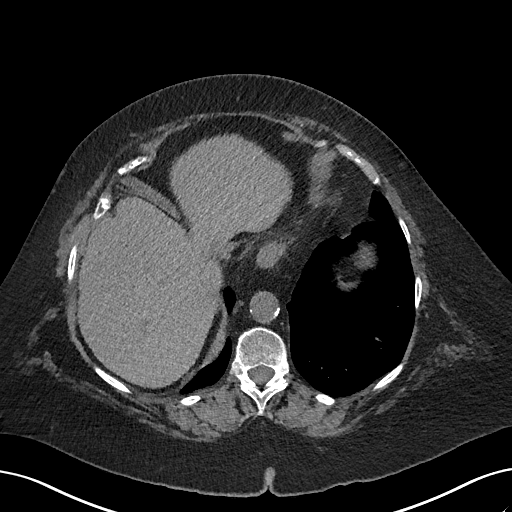
[im 94/126  lung]
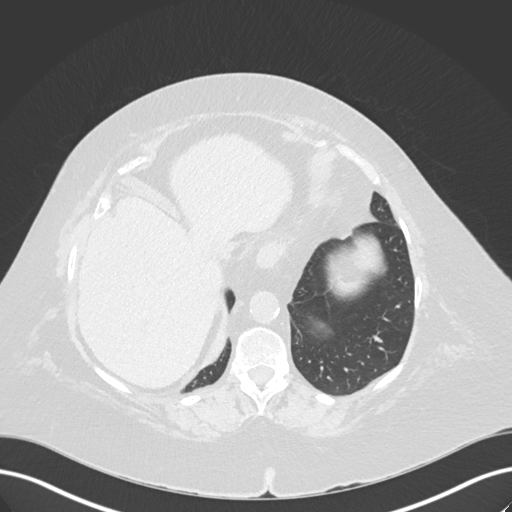
[im 110/126  soft-tissue]
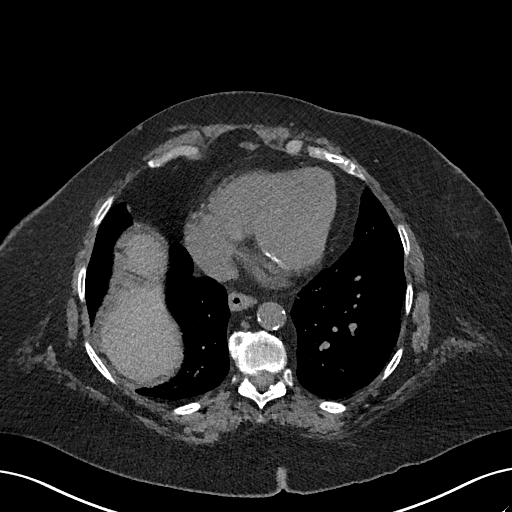
[im 110/126  lung]
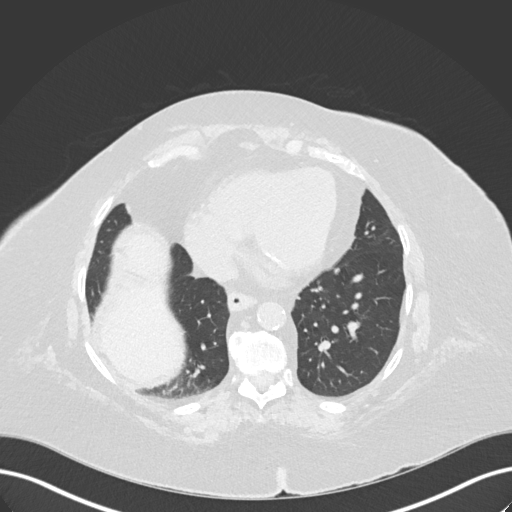

[Series 7: renal without cor · coronal · non-contrast · 0.49mm/px · 2 of 189 slices shown, 3 images]
[im 63/189  soft-tissue]
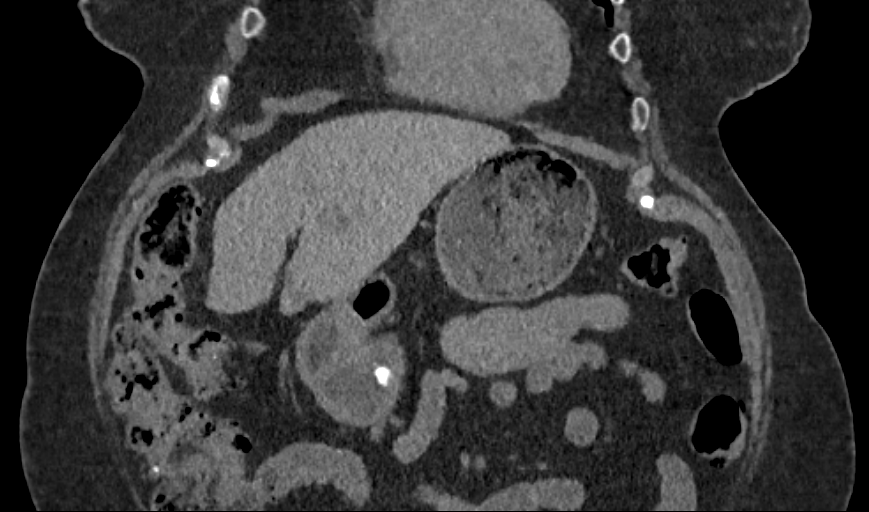
[im 63/189  bone]
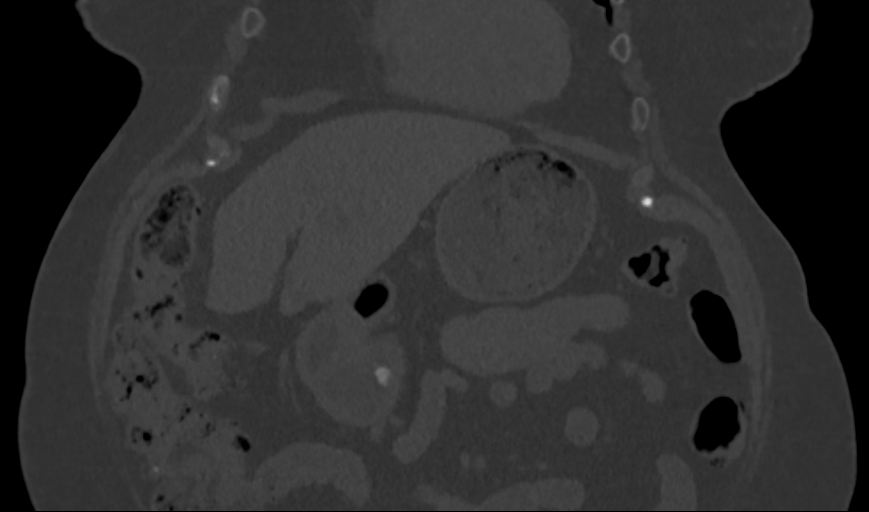
[im 126/189  soft-tissue]
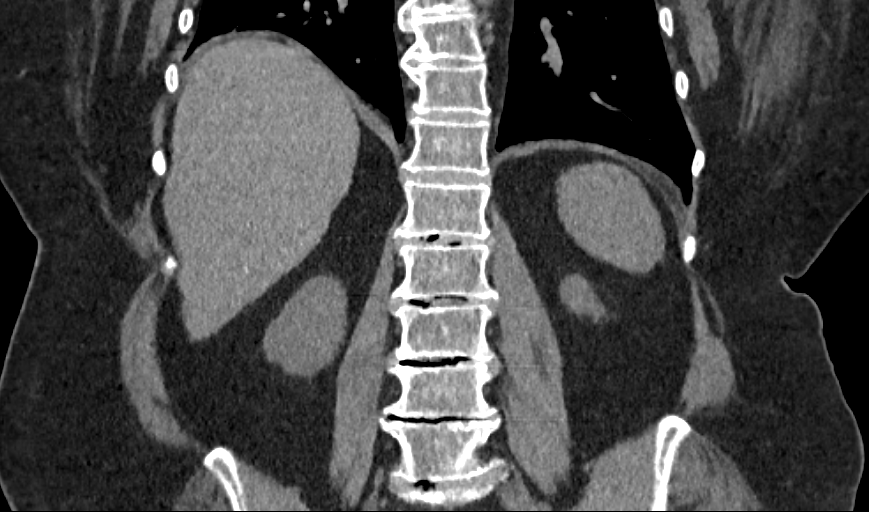

[Series 9: renal arterial. · axial · arterial · 0.78mm/px · z∈[-714,-682]mm · 2 of 125 slices shown]
[im 16/125  soft-tissue]
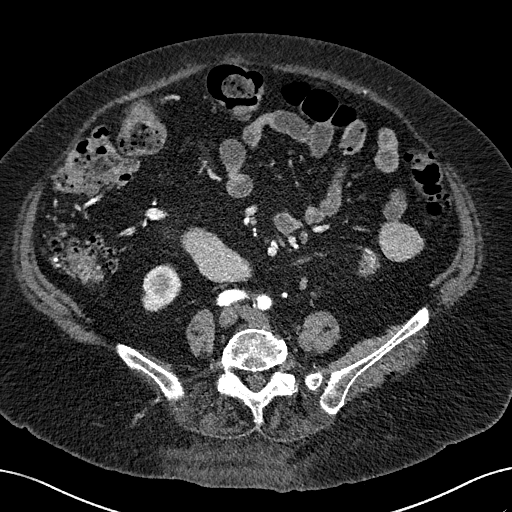
[im 32/125  soft-tissue]
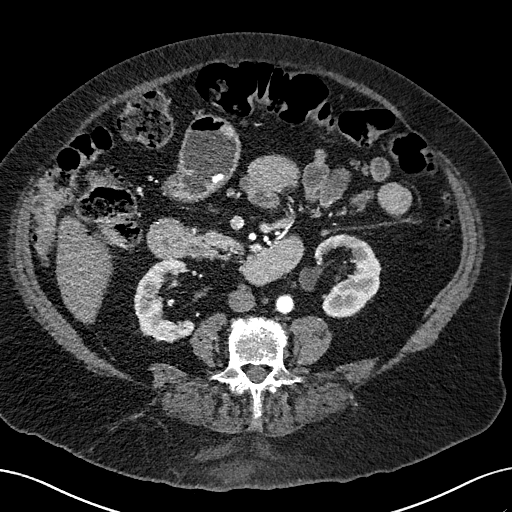

[11 of 46 positions shown; findings below may reference images not displayed]

FINDINGS: Lower chest:  Lung bases are clear.

Hepatobiliary: Nodular hepatic contour, suggesting cirrhosis. No
focal hepatic lesion is seen.

Gallbladder is unremarkable. No intrahepatic or extrahepatic ductal
dilatation.

Pancreas: Within normal limits.

Spleen: Within normal limits.

Adrenals/Urinary Tract: 2.4 cm left adrenal cyst, less likely
adenoma, benign.

Right adrenal gland is within normal limits.

2.4 x 2.6 cm anterior left lower pole renal cyst, with mildly
irregular margins, but no definite enhancement following contrast
administration, benign (Bosniak II). Cortical lobulation/scarring
involving the left kidney.

Right kidney is within normal limits.  No enhancing renal lesions.

No renal calculi or hydronephrosis.

Stomach/Bowel: Stomach is within normal limits.

Visualized bowel is notable for colonic diverticulosis.

Vascular/Lymphatic: No evidence abdominal aortic aneurysm.
Atherosclerotic calcifications of the abdominal aorta and branch
vessels.

No suspicious abdominal lymphadenopathy.

Other: No abdominal ascites.

Musculoskeletal: Degenerative changes the visualized thoracolumbar
spine
IMPRESSION: 2.6 cm anterior left lower pole renal cyst, mildly irregular, but
without enhancement, benign (Bosniak II).

No enhancing renal lesions.

Nodular hepatic contour, raising the possibility of cirrhosis.

Please note that the pelvis was not imaged.
